# Patient Record
Sex: Male | Born: 1955 | Race: White | Hispanic: No
Health system: Southern US, Community
[De-identification: ages and names within clinical notes are randomized; demographics above are authoritative.]

---

## 2004-10-11 ENCOUNTER — Emergency Department: Payer: Self-pay | Admitting: Unknown Physician Specialty

## 2006-07-27 ENCOUNTER — Ambulatory Visit: Payer: Self-pay | Admitting: Physician Assistant

## 2010-05-28 ENCOUNTER — Inpatient Hospital Stay: Payer: Self-pay | Admitting: Internal Medicine

## 2010-06-22 ENCOUNTER — Inpatient Hospital Stay: Payer: Self-pay | Admitting: Internal Medicine

## 2014-01-01 ENCOUNTER — Emergency Department: Payer: Self-pay | Admitting: Emergency Medicine

## 2014-01-01 LAB — URINALYSIS, COMPLETE
Bacteria: NONE SEEN
Bilirubin,UR: NEGATIVE
Hyaline Cast: 2
Ketone: NEGATIVE
LEUKOCYTE ESTERASE: NEGATIVE
Nitrite: NEGATIVE
Ph: 6 (ref 4.5–8.0)
Protein: 30
RBC,UR: 4 /HPF (ref 0–5)
Specific Gravity: 1.017 (ref 1.003–1.030)
WBC UR: 1 /HPF (ref 0–5)

## 2014-01-01 LAB — DRUG SCREEN, URINE
AMPHETAMINES, UR SCREEN: NEGATIVE (ref ?–1000)
Barbiturates, Ur Screen: NEGATIVE (ref ?–200)
Benzodiazepine, Ur Scrn: POSITIVE (ref ?–200)
COCAINE METABOLITE, UR ~~LOC~~: NEGATIVE (ref ?–300)
Cannabinoid 50 Ng, Ur ~~LOC~~: POSITIVE (ref ?–50)
MDMA (ECSTASY) UR SCREEN: NEGATIVE (ref ?–500)
METHADONE, UR SCREEN: NEGATIVE (ref ?–300)
Opiate, Ur Screen: NEGATIVE (ref ?–300)
Phencyclidine (PCP) Ur S: NEGATIVE (ref ?–25)
Tricyclic, Ur Screen: POSITIVE (ref ?–1000)

## 2014-01-01 LAB — COMPREHENSIVE METABOLIC PANEL
ALK PHOS: 105 U/L
ALT: 36 U/L (ref 12–78)
ANION GAP: 5 — AB (ref 7–16)
Albumin: 2.2 g/dL — ABNORMAL LOW (ref 3.4–5.0)
BUN: 7 mg/dL (ref 7–18)
Bilirubin,Total: 1.9 mg/dL — ABNORMAL HIGH (ref 0.2–1.0)
CHLORIDE: 106 mmol/L (ref 98–107)
CO2: 26 mmol/L (ref 21–32)
CREATININE: 0.87 mg/dL (ref 0.60–1.30)
Calcium, Total: 8.1 mg/dL — ABNORMAL LOW (ref 8.5–10.1)
EGFR (African American): >60
EGFR (Non-African Amer.): >60
Glucose: 296 mg/dL — ABNORMAL HIGH (ref 65–99)
OSMOLALITY: 283 (ref 275–301)
Potassium: 3.9 mmol/L (ref 3.5–5.1)
SGOT(AST): 52 U/L — ABNORMAL HIGH (ref 15–37)
Sodium: 137 mmol/L (ref 136–145)
TOTAL PROTEIN: 6 g/dL — AB (ref 6.4–8.2)

## 2014-01-01 LAB — CBC
HCT: 35.8 % — ABNORMAL LOW (ref 40.0–52.0)
HGB: 12.4 g/dL — AB (ref 13.0–18.0)
MCH: 33.6 pg (ref 26.0–34.0)
MCHC: 34.5 g/dL (ref 32.0–36.0)
MCV: 97 fL (ref 80–100)
Platelet: 62 10*3/uL — ABNORMAL LOW (ref 150–440)
RBC: 3.68 10*6/uL — AB (ref 4.40–5.90)
RDW: 15.6 % — AB (ref 11.5–14.5)
WBC: 4.2 10*3/uL (ref 3.8–10.6)

## 2014-01-01 LAB — ETHANOL
Ethanol %: <0.003 % (ref 0.000–0.080)
Ethanol: <3 mg/dL

## 2014-01-01 LAB — PROTIME-INR
INR: 1.6
Prothrombin Time: 19.1 secs — ABNORMAL HIGH (ref 11.5–14.7)

## 2014-03-06 ENCOUNTER — Inpatient Hospital Stay: Payer: Self-pay | Admitting: Internal Medicine

## 2014-03-06 LAB — URINALYSIS, COMPLETE
Bacteria: NONE SEEN
Bilirubin,UR: NEGATIVE
Glucose,UR: NEGATIVE mg/dL (ref 0–75)
Hyaline Cast: 58
Ketone: NEGATIVE
Leukocyte Esterase: NEGATIVE
Nitrite: NEGATIVE
PH: 5 (ref 4.5–8.0)
Protein: 30
RBC,UR: 9 /HPF (ref 0–5)
Specific Gravity: 1.016 (ref 1.003–1.030)
Squamous Epithelial: 1

## 2014-03-06 LAB — CBC
HCT: 35.6 % — AB (ref 40.0–52.0)
HGB: 11.9 g/dL — AB (ref 13.0–18.0)
MCH: 31.9 pg (ref 26.0–34.0)
MCHC: 33.4 g/dL (ref 32.0–36.0)
MCV: 96 fL (ref 80–100)
PLATELETS: 81 10*3/uL — AB (ref 150–440)
RBC: 3.72 10*6/uL — ABNORMAL LOW (ref 4.40–5.90)
RDW: 15.8 % — ABNORMAL HIGH (ref 11.5–14.5)
WBC: 6.7 10*3/uL (ref 3.8–10.6)

## 2014-03-06 LAB — DRUG SCREEN, URINE
Amphetamines, Ur Screen: NEGATIVE (ref ?–1000)
BARBITURATES, UR SCREEN: NEGATIVE (ref ?–200)
Benzodiazepine, Ur Scrn: POSITIVE (ref ?–200)
COCAINE METABOLITE, UR ~~LOC~~: NEGATIVE (ref ?–300)
Cannabinoid 50 Ng, Ur ~~LOC~~: POSITIVE (ref ?–50)
MDMA (Ecstasy)Ur Screen: NEGATIVE (ref ?–500)
METHADONE, UR SCREEN: NEGATIVE (ref ?–300)
Opiate, Ur Screen: POSITIVE (ref ?–300)
PHENCYCLIDINE (PCP) UR S: NEGATIVE (ref ?–25)
TRICYCLIC, UR SCREEN: NEGATIVE (ref ?–1000)

## 2014-03-06 LAB — AMMONIA: Ammonia, Plasma: 57 mcmol/L — ABNORMAL HIGH (ref 11–32)

## 2014-03-06 LAB — COMPREHENSIVE METABOLIC PANEL
ALBUMIN: 2.4 g/dL — AB (ref 3.4–5.0)
ALT: 28 U/L
ANION GAP: 9 (ref 7–16)
Alkaline Phosphatase: 95 U/L
BUN: 22 mg/dL — AB (ref 7–18)
Bilirubin,Total: 2.4 mg/dL — ABNORMAL HIGH (ref 0.2–1.0)
CALCIUM: 8.8 mg/dL (ref 8.5–10.1)
CO2: 25 mmol/L (ref 21–32)
CREATININE: 1.47 mg/dL — AB (ref 0.60–1.30)
Chloride: 105 mmol/L (ref 98–107)
EGFR (African American): >60
EGFR (Non-African Amer.): 53 — ABNORMAL LOW
GLUCOSE: 224 mg/dL — AB (ref 65–99)
Osmolality: 288 (ref 275–301)
POTASSIUM: 3.9 mmol/L (ref 3.5–5.1)
SGOT(AST): 85 U/L — ABNORMAL HIGH (ref 15–37)
SODIUM: 139 mmol/L (ref 136–145)
TOTAL PROTEIN: 7.4 g/dL (ref 6.4–8.2)

## 2014-03-06 LAB — TROPONIN I: Troponin-I: 0.03 ng/mL

## 2014-03-06 LAB — ETHANOL

## 2014-03-07 LAB — CBC WITH DIFFERENTIAL/PLATELET
BASOS ABS: 0 10*3/uL (ref 0.0–0.1)
Basophil %: 0.9 %
Eosinophil #: 0.3 10*3/uL (ref 0.0–0.7)
Eosinophil %: 5.1 %
HCT: 32.7 % — AB (ref 40.0–52.0)
HGB: 11.1 g/dL — ABNORMAL LOW (ref 13.0–18.0)
LYMPHS ABS: 1.2 10*3/uL (ref 1.0–3.6)
LYMPHS PCT: 22.7 %
MCH: 32 pg (ref 26.0–34.0)
MCHC: 33.9 g/dL (ref 32.0–36.0)
MCV: 94 fL (ref 80–100)
Monocyte #: 0.5 x10 3/mm (ref 0.2–1.0)
Monocyte %: 9.9 %
NEUTROS ABS: 3.1 10*3/uL (ref 1.4–6.5)
Neutrophil %: 61.4 %
Platelet: 62 10*3/uL — ABNORMAL LOW (ref 150–440)
RBC: 3.46 10*6/uL — ABNORMAL LOW (ref 4.40–5.90)
RDW: 15.3 % — AB (ref 11.5–14.5)
WBC: 5.1 10*3/uL (ref 3.8–10.6)

## 2014-03-07 LAB — BASIC METABOLIC PANEL
Anion Gap: 10 (ref 7–16)
BUN: 18 mg/dL (ref 7–18)
CALCIUM: 8.5 mg/dL (ref 8.5–10.1)
CREATININE: 1.09 mg/dL (ref 0.60–1.30)
Chloride: 110 mmol/L — ABNORMAL HIGH (ref 98–107)
Co2: 25 mmol/L (ref 21–32)
EGFR (African American): >60
EGFR (Non-African Amer.): >60
Glucose: 95 mg/dL (ref 65–99)
Osmolality: 290 (ref 275–301)
POTASSIUM: 3.2 mmol/L — AB (ref 3.5–5.1)
Sodium: 145 mmol/L (ref 136–145)

## 2014-03-07 LAB — AMMONIA: AMMONIA, PLASMA: 49 umol/L — AB (ref 11–32)

## 2014-03-08 LAB — COMPREHENSIVE METABOLIC PANEL
ALBUMIN: 1.9 g/dL — AB (ref 3.4–5.0)
ALK PHOS: 82 U/L
ANION GAP: 6 — AB (ref 7–16)
BUN: 12 mg/dL (ref 7–18)
Bilirubin,Total: 1.3 mg/dL — ABNORMAL HIGH (ref 0.2–1.0)
CALCIUM: 8.4 mg/dL — AB (ref 8.5–10.1)
CO2: 28 mmol/L (ref 21–32)
Chloride: 107 mmol/L (ref 98–107)
Creatinine: 1.02 mg/dL (ref 0.60–1.30)
EGFR (Non-African Amer.): >60
Glucose: 89 mg/dL (ref 65–99)
Osmolality: 280 (ref 275–301)
Potassium: 3.3 mmol/L — ABNORMAL LOW (ref 3.5–5.1)
SGOT(AST): 73 U/L — ABNORMAL HIGH (ref 15–37)
SGPT (ALT): 29 U/L
SODIUM: 141 mmol/L (ref 136–145)
TOTAL PROTEIN: 6.3 g/dL — AB (ref 6.4–8.2)

## 2014-03-08 LAB — AMMONIA: Ammonia, Plasma: 51 mcmol/L — ABNORMAL HIGH (ref 11–32)

## 2014-03-09 LAB — COMPREHENSIVE METABOLIC PANEL
ALK PHOS: 80 U/L
Albumin: 1.9 g/dL — ABNORMAL LOW (ref 3.4–5.0)
Anion Gap: 9 (ref 7–16)
BILIRUBIN TOTAL: 1.2 mg/dL — AB (ref 0.2–1.0)
BUN: 13 mg/dL (ref 7–18)
Calcium, Total: 8.2 mg/dL — ABNORMAL LOW (ref 8.5–10.1)
Chloride: 105 mmol/L (ref 98–107)
Co2: 25 mmol/L (ref 21–32)
Creatinine: 1.08 mg/dL (ref 0.60–1.30)
EGFR (Non-African Amer.): >60
GLUCOSE: 143 mg/dL — AB (ref 65–99)
Osmolality: 280 (ref 275–301)
Potassium: 3.4 mmol/L — ABNORMAL LOW (ref 3.5–5.1)
SGOT(AST): 61 U/L — ABNORMAL HIGH (ref 15–37)
SGPT (ALT): 28 U/L
SODIUM: 139 mmol/L (ref 136–145)
TOTAL PROTEIN: 6.3 g/dL — AB (ref 6.4–8.2)

## 2014-03-09 LAB — AMMONIA: AMMONIA, PLASMA: 71 umol/L — AB (ref 11–32)

## 2014-03-10 LAB — CBC WITH DIFFERENTIAL/PLATELET
BASOS ABS: 0 10*3/uL (ref 0.0–0.1)
Basophil %: 0.8 %
EOS ABS: 0.5 10*3/uL (ref 0.0–0.7)
Eosinophil %: 8.5 %
HCT: 35.4 % — ABNORMAL LOW (ref 40.0–52.0)
HGB: 11.9 g/dL — AB (ref 13.0–18.0)
LYMPHS ABS: 1.6 10*3/uL (ref 1.0–3.6)
LYMPHS PCT: 26.5 %
MCH: 31.9 pg (ref 26.0–34.0)
MCHC: 33.6 g/dL (ref 32.0–36.0)
MCV: 95 fL (ref 80–100)
MONOS PCT: 10.4 %
Monocyte #: 0.6 x10 3/mm (ref 0.2–1.0)
Neutrophil #: 3.2 10*3/uL (ref 1.4–6.5)
Neutrophil %: 53.8 %
PLATELETS: 90 10*3/uL — AB (ref 150–440)
RBC: 3.73 10*6/uL — ABNORMAL LOW (ref 4.40–5.90)
RDW: 16 % — AB (ref 11.5–14.5)
WBC: 5.9 10*3/uL (ref 3.8–10.6)

## 2014-03-10 LAB — HEMOGLOBIN A1C: Hemoglobin A1C: 6.9 % — ABNORMAL HIGH (ref 4.2–6.3)

## 2014-03-10 LAB — AMMONIA: AMMONIA, PLASMA: 52 umol/L — AB (ref 11–32)

## 2014-03-15 ENCOUNTER — Emergency Department: Payer: Self-pay | Admitting: Emergency Medicine

## 2014-03-15 LAB — COMPREHENSIVE METABOLIC PANEL
ALT: 44 U/L
AST: 66 U/L — AB (ref 15–37)
Albumin: 2 g/dL — ABNORMAL LOW (ref 3.4–5.0)
Alkaline Phosphatase: 91 U/L
Anion Gap: 5 — ABNORMAL LOW (ref 7–16)
BUN: 9 mg/dL (ref 7–18)
Bilirubin,Total: 1.2 mg/dL — ABNORMAL HIGH (ref 0.2–1.0)
CO2: 26 mmol/L (ref 21–32)
Calcium, Total: 10.6 mg/dL — ABNORMAL HIGH (ref 8.5–10.1)
Chloride: 106 mmol/L (ref 98–107)
Creatinine: 1.08 mg/dL (ref 0.60–1.30)
EGFR (Non-African Amer.): >60
Glucose: 353 mg/dL — ABNORMAL HIGH (ref 65–99)
OSMOLALITY: 287 (ref 275–301)
POTASSIUM: 4.7 mmol/L (ref 3.5–5.1)
Sodium: 137 mmol/L (ref 136–145)
Total Protein: 6.9 g/dL (ref 6.4–8.2)

## 2014-03-15 LAB — CBC
HCT: 34 % — AB (ref 40.0–52.0)
HGB: 11.2 g/dL — ABNORMAL LOW (ref 13.0–18.0)
MCH: 31.6 pg (ref 26.0–34.0)
MCHC: 33 g/dL (ref 32.0–36.0)
MCV: 96 fL (ref 80–100)
Platelet: 68 10*3/uL — ABNORMAL LOW (ref 150–440)
RBC: 3.56 10*6/uL — AB (ref 4.40–5.90)
RDW: 15.9 % — ABNORMAL HIGH (ref 11.5–14.5)
WBC: 4.7 10*3/uL (ref 3.8–10.6)

## 2014-03-15 LAB — SALICYLATE LEVEL

## 2014-03-15 LAB — ACETAMINOPHEN LEVEL

## 2014-03-15 LAB — ETHANOL

## 2014-03-20 ENCOUNTER — Inpatient Hospital Stay: Payer: Self-pay | Admitting: Internal Medicine

## 2014-03-20 LAB — COMPREHENSIVE METABOLIC PANEL
ALT: 45 U/L
Albumin: 1.9 g/dL — ABNORMAL LOW (ref 3.4–5.0)
Alkaline Phosphatase: 78 U/L
Anion Gap: 8 (ref 7–16)
BUN: 30 mg/dL — ABNORMAL HIGH (ref 7–18)
Bilirubin,Total: 1 mg/dL (ref 0.2–1.0)
CHLORIDE: 105 mmol/L (ref 98–107)
Calcium, Total: 9.4 mg/dL (ref 8.5–10.1)
Co2: 24 mmol/L (ref 21–32)
Creatinine: 1.46 mg/dL — ABNORMAL HIGH (ref 0.60–1.30)
EGFR (African American): >60
EGFR (Non-African Amer.): 53 — ABNORMAL LOW
Glucose: 106 mg/dL — ABNORMAL HIGH (ref 65–99)
Osmolality: 280 (ref 275–301)
Potassium: 3.6 mmol/L (ref 3.5–5.1)
SGOT(AST): 61 U/L — ABNORMAL HIGH (ref 15–37)
Sodium: 137 mmol/L (ref 136–145)
TOTAL PROTEIN: 6.5 g/dL (ref 6.4–8.2)

## 2014-03-20 LAB — CBC
HCT: 29.9 % — ABNORMAL LOW (ref 40.0–52.0)
HGB: 10 g/dL — ABNORMAL LOW (ref 13.0–18.0)
MCH: 31.4 pg (ref 26.0–34.0)
MCHC: 33.3 g/dL (ref 32.0–36.0)
MCV: 94 fL (ref 80–100)
Platelet: 46 10*3/uL — ABNORMAL LOW (ref 150–440)
RBC: 3.17 10*6/uL — AB (ref 4.40–5.90)
RDW: 16 % — AB (ref 11.5–14.5)
WBC: 10.5 10*3/uL (ref 3.8–10.6)

## 2014-03-20 LAB — DRUG SCREEN, URINE
Amphetamines, Ur Screen: NEGATIVE (ref ?–1000)
BENZODIAZEPINE, UR SCRN: POSITIVE (ref ?–200)
Barbiturates, Ur Screen: NEGATIVE (ref ?–200)
COCAINE METABOLITE, UR ~~LOC~~: NEGATIVE (ref ?–300)
Cannabinoid 50 Ng, Ur ~~LOC~~: POSITIVE (ref ?–50)
MDMA (ECSTASY) UR SCREEN: NEGATIVE (ref ?–500)
Methadone, Ur Screen: NEGATIVE (ref ?–300)
Opiate, Ur Screen: NEGATIVE (ref ?–300)
Phencyclidine (PCP) Ur S: NEGATIVE (ref ?–25)
TRICYCLIC, UR SCREEN: POSITIVE (ref ?–1000)

## 2014-03-20 LAB — URINALYSIS, COMPLETE
Bacteria: NONE SEEN
Bilirubin,UR: NEGATIVE
Ketone: NEGATIVE
Leukocyte Esterase: NEGATIVE
NITRITE: NEGATIVE
PH: 5 (ref 4.5–8.0)
Protein: NEGATIVE
RBC,UR: 1 /HPF (ref 0–5)
SQUAMOUS EPITHELIAL: NONE SEEN
Specific Gravity: 1.017 (ref 1.003–1.030)

## 2014-03-20 LAB — ETHANOL: Ethanol: <3 mg/dL

## 2014-03-20 LAB — TROPONIN I: Troponin-I: <0.02 ng/mL

## 2014-03-20 LAB — AMMONIA: Ammonia, Plasma: 25 mcmol/L (ref 11–32)

## 2014-03-20 LAB — PROTIME-INR
INR: 1.5
Prothrombin Time: 17.4 secs — ABNORMAL HIGH (ref 11.5–14.7)

## 2014-03-21 LAB — CBC WITH DIFFERENTIAL/PLATELET
Basophil #: 0 10*3/uL (ref 0.0–0.1)
Basophil %: 0.3 %
EOS ABS: 0.1 10*3/uL (ref 0.0–0.7)
Eosinophil %: 2.4 %
HCT: 30 % — ABNORMAL LOW (ref 40.0–52.0)
HGB: 10.3 g/dL — AB (ref 13.0–18.0)
Lymphocyte #: 0.9 10*3/uL — ABNORMAL LOW (ref 1.0–3.6)
Lymphocyte %: 15 %
MCH: 32 pg (ref 26.0–34.0)
MCHC: 34.3 g/dL (ref 32.0–36.0)
MCV: 93 fL (ref 80–100)
MONO ABS: 0.6 x10 3/mm (ref 0.2–1.0)
Monocyte %: 9.5 %
NEUTROS PCT: 72.8 %
Neutrophil #: 4.2 10*3/uL (ref 1.4–6.5)
Platelet: 49 10*3/uL — ABNORMAL LOW (ref 150–440)
RBC: 3.21 10*6/uL — ABNORMAL LOW (ref 4.40–5.90)
RDW: 16.2 % — ABNORMAL HIGH (ref 11.5–14.5)
WBC: 5.8 10*3/uL (ref 3.8–10.6)

## 2014-03-21 LAB — BASIC METABOLIC PANEL
Anion Gap: 4 — ABNORMAL LOW (ref 7–16)
BUN: 22 mg/dL — AB (ref 7–18)
Calcium, Total: 8.7 mg/dL (ref 8.5–10.1)
Chloride: 111 mmol/L — ABNORMAL HIGH (ref 98–107)
Co2: 23 mmol/L (ref 21–32)
Creatinine: 1.17 mg/dL (ref 0.60–1.30)
EGFR (Non-African Amer.): >60
GLUCOSE: 73 mg/dL (ref 65–99)
Osmolality: 278 (ref 275–301)
Potassium: 3.5 mmol/L (ref 3.5–5.1)
SODIUM: 138 mmol/L (ref 136–145)

## 2014-04-02 DEATH — deceased

## 2014-10-24 NOTE — Discharge Summary (Signed)
PATIENT NAME:  Shaun Payne, Shaun Payne MR#:  [id number] DATE OF BIRTH:  1956-04-13  DATE OF ADMISSION:  03/20/2014  DATE OF DISCHARGE:  03/21/2014  PRIMARY CARE PHYSICIAN: Nonlocal.  PRIMARY CARE PHYSICIAN: Dr. Luanna SalkEeason.  DISCHARGE DIAGNOSES:  1. Drug-induced encephalopathy.  2. Acute renal failure. 3. Liver cirrhosis.  4. Hypertension.  5. Diabetes.  6. Thrombocytopenia.  7. Anemia.   CONDITION: Stable.   CODE STATUS: Full code.   HOME MEDICATIONS: Please refer to the medication reconciliation list.   ACTIVITY: As tolerated.   FOLLOW-UP CARE: Followup with PCP within 1 to 2 weeks. The patient need to resume home health and physical therapy. Also, the patient needs medications safety management due to drug-induced encephalopathy.   REASON FOR ADMISSION: Altered mental status.   HOSPITAL COURSE: The patient is a 59 year old male with a history of liver cirrhosis, hepatitis C, hypertension, diabetes who was sent from home to ED due to altered mental status. The patient's ammonia level was normal. Glucose was normal. The patient's urine drug screen showed positive tricyclic NT present, cannabinoid and benzodiazepines. The patient's CAT scan of head did not show any acute intracranial pathology. Ammonia level is normal.  1. Acute encephalopathy, possibly due to drug-induced encephalopathy. After admission, the patient's home medications were on hold. The patient has been treated with IV fluid support. The patient now is some alert, awake, oriented, but has poor insight, sometimes agitated and angry. According to the patient's sister, the patient is back to his baseline. I discussed the patient's condition and home environment with the patient's sister and otherwise, the patient's sister about medication safety. The patient medications may be logged.  2. Acute renal failure. The patient's BUN and creatinine was 30 and 1.46. After IV fluid support, the patient's renal failure improved.  3. Anemia  and thrombocytopenia, stable, possibly due to chronic liver disease.  4. The patient has no complaints. He is clinically stable and will be discharged to home with home health and PT today. I discussed the patient's discharge plan with the patient's sister, nurse and case manager.   TIME SPENT: About 77 minutes.     ____________________________ Shaun Payne Rosalinda Seaman, MD qc:TT D: 03/21/2014 13:40:09 ET T: 03/21/2014 17:39:31 ET JOB#: [id number]  cc: Shaun Payne Emit Kuenzel, MD, <Dictator> Shaun Payne Ameya Kutz MD ELECTRONICALLY SIGNED 03/23/2014 12:03
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
## 2014-10-24 NOTE — H&P (Signed)
PATIENT NAME:  Shaun Payne, Shaun Payne MR#:  [id number] DATE OF BIRTH:  [birth year]  DATE OF ADMISSION:  03/06/2014  ADMITTING PHYSICIAN: Enid Baasadhika Cherene Dobbins, MD.   PRIMARY CARE PHYSICIAN: None.   CHIEF COMPLAINT: Altered mental status.   BRIEF HISTORY: Shaun Payne is a 59 year old, Caucasian male with a past medical history significant for hepatitis C, liver cirrhosis and a past history of alcohol abuse, brought to the hospital by his niece as he was found   DICTATION ENDS HERE .     ____________________________ Enid Baasadhika Dj Senteno, MD rk:TT D: 03/06/2014 20:18:10 ET T: 03/06/2014 20:54:11 ET JOB#: [id number]  cc: Enid Baasadhika Genese Quebedeaux, MD, <Dictator> Enid BaasADHIKA Aadhira Heffernan MD ELECTRONICALLY SIGNED 03/09/2014 17:30

## 2016-06-12 IMAGING — CT CT HEAD WITHOUT CONTRAST
1 of 2 series · 13 of 30 positions shown, 17 images · non-contrast
Comparison: 03/06/2014

CLINICAL DATA: Patient presents to ed with family who reports pt
has been confused and disoriented. Pale at triage, with hypotension,
oriented to self and date of birth only, appears lethargic and weak.

EXAM:
CT HEAD WITHOUT CONTRAST
TECHNIQUE: Contiguous axial images were obtained from the base of the skull
through the vertex without intravenous contrast.

[Series 2: soft tissue · axial · 0.42mm/px · z∈[-61,+64]mm · 13 of 31 slices shown, 17 images]
[im 3/31  brain]
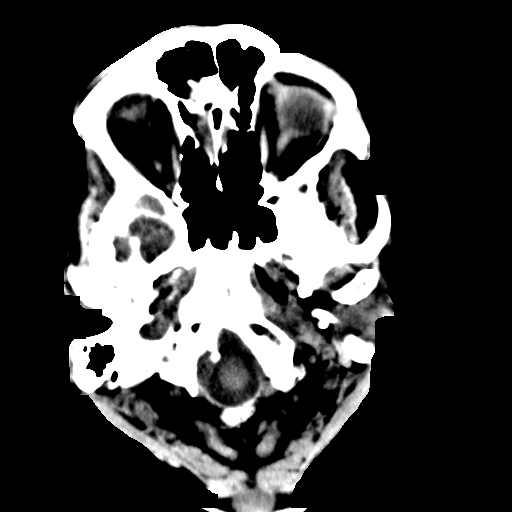
[im 3/31  bone]
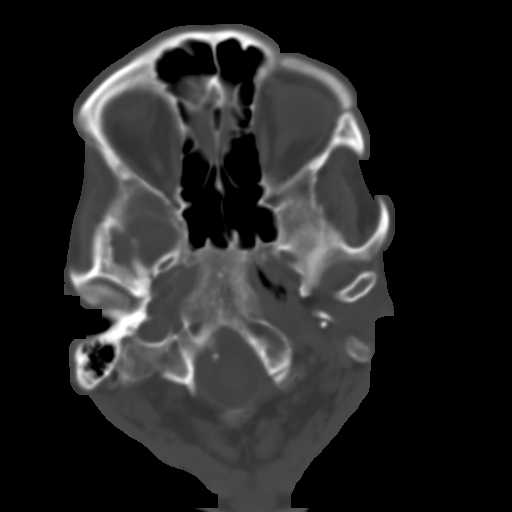
[im 5/31  brain]
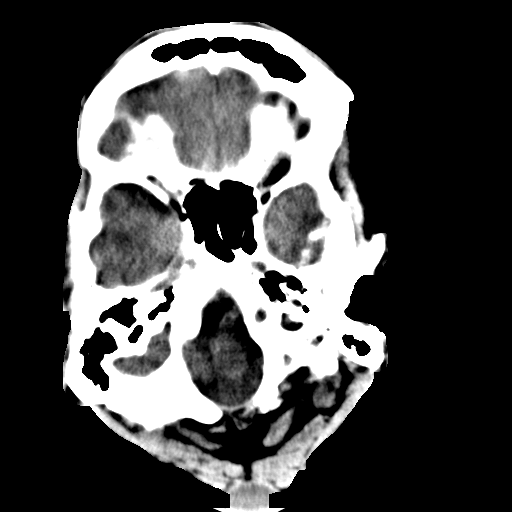
[im 7/31  brain]
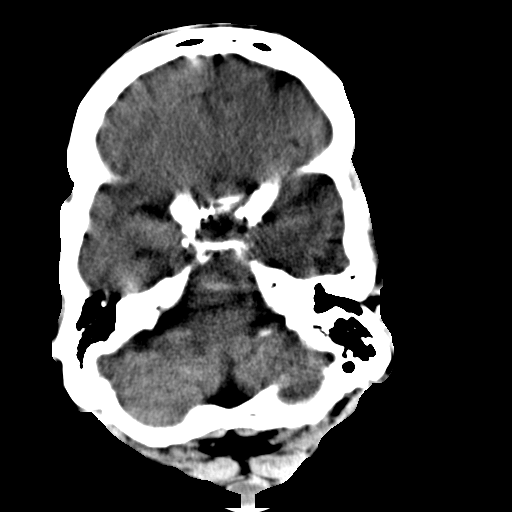
[im 9/31  brain]
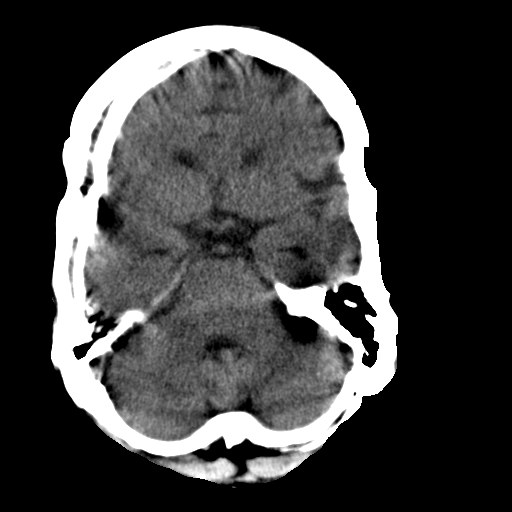
[im 11/31  brain]
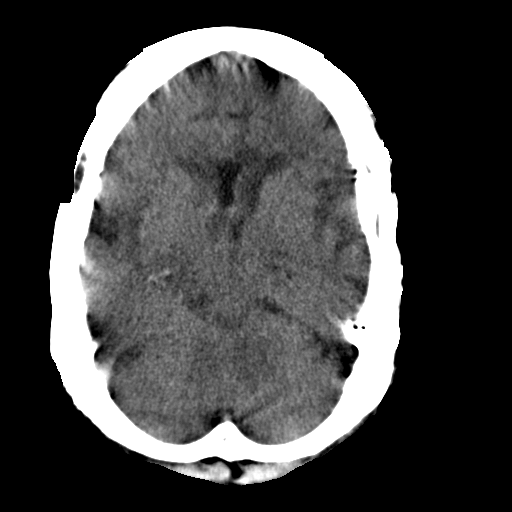
[im 11/31  bone]
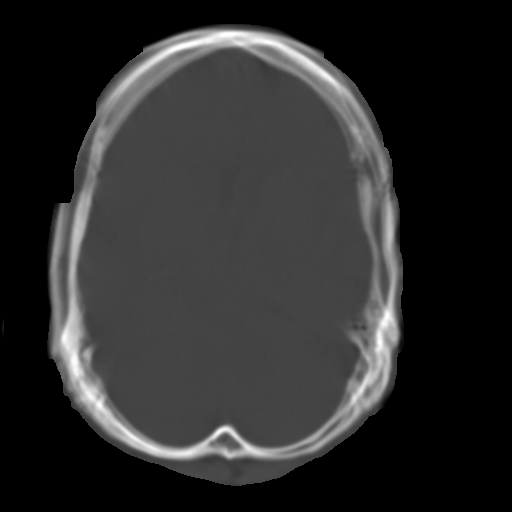
[im 13/31  brain]
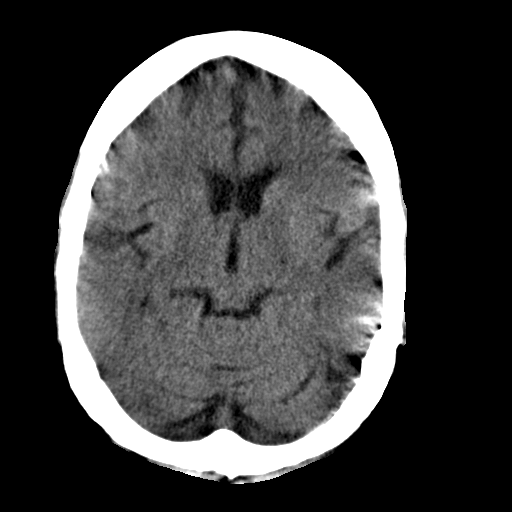
[im 16/31  brain]
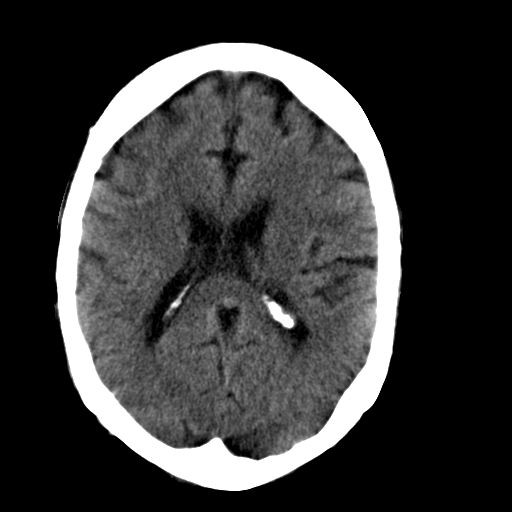
[im 18/31  brain]
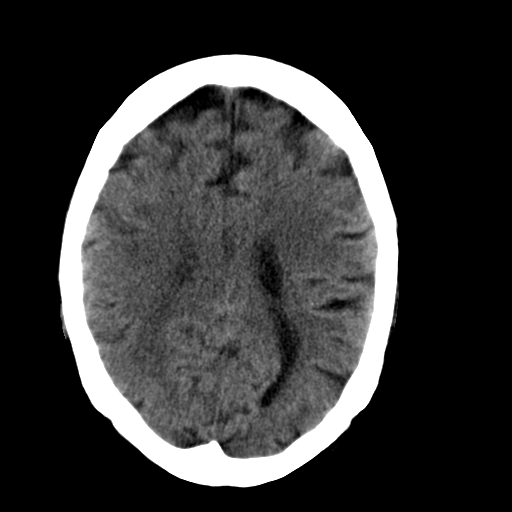
[im 20/31  brain]
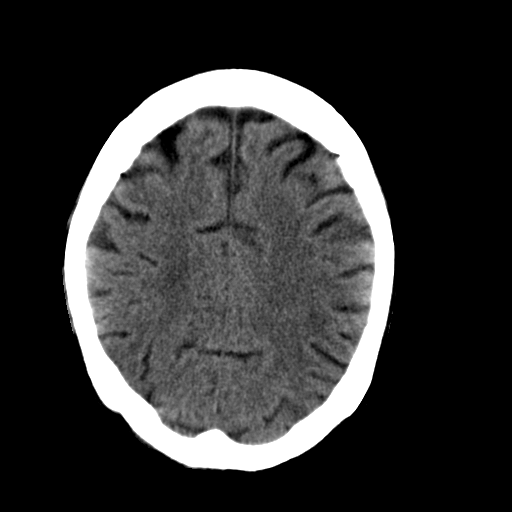
[im 20/31  bone]
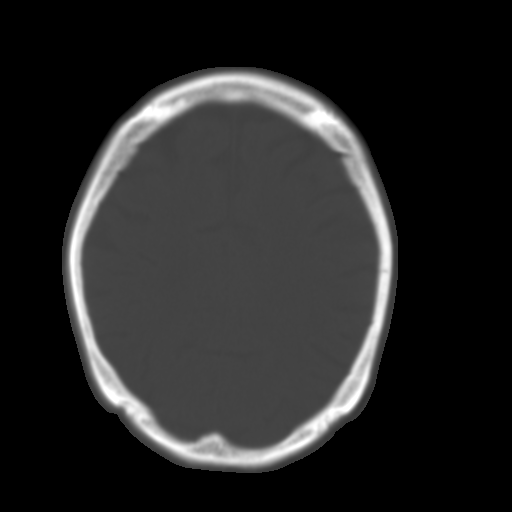
[im 22/31  brain]
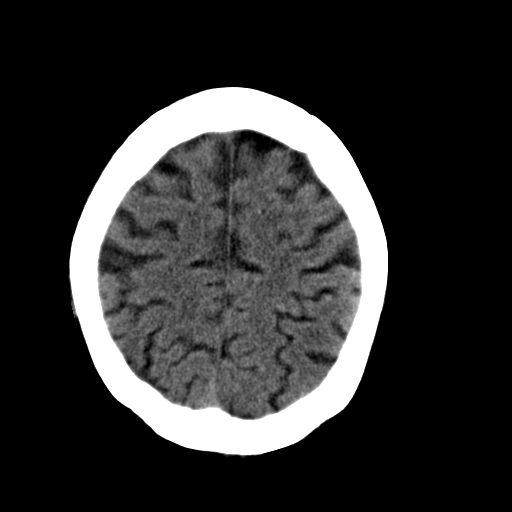
[im 24/31  brain]
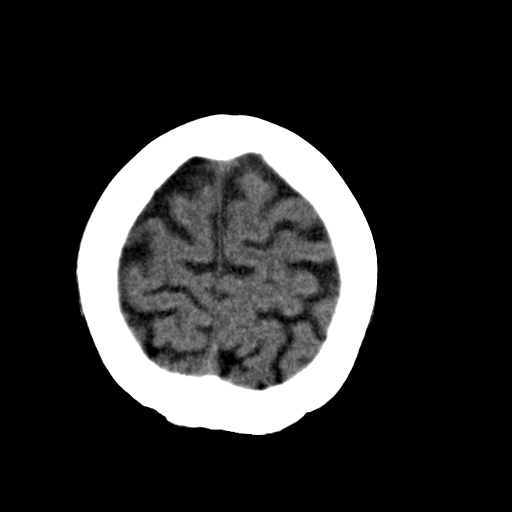
[im 26/31  brain]
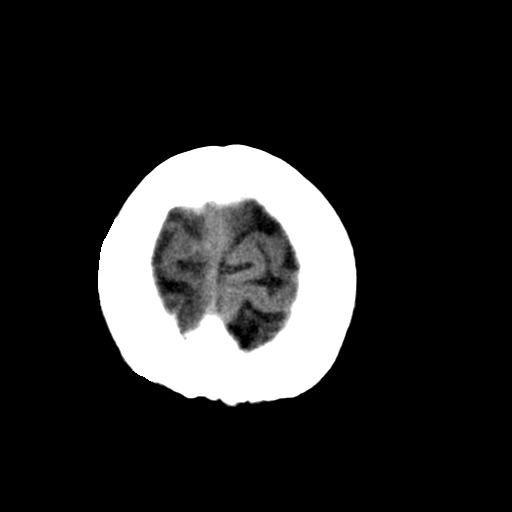
[im 28/31  brain]
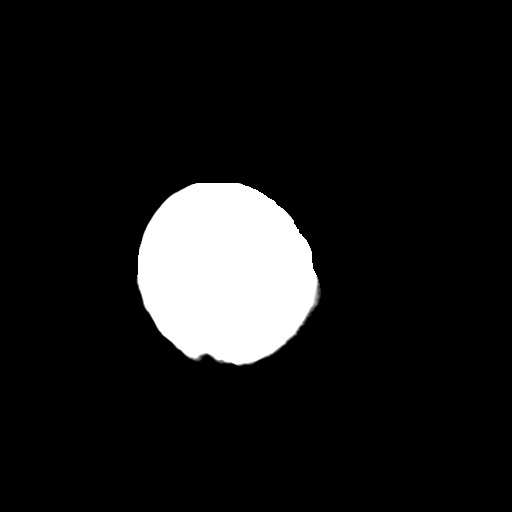
[im 28/31  bone]
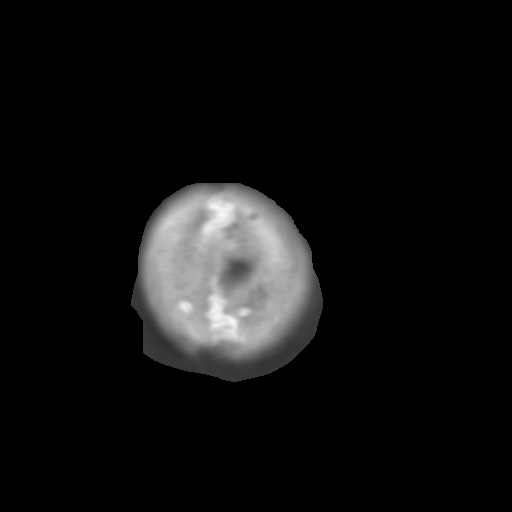

[13 of 30 positions shown; findings below may reference images not displayed]

FINDINGS: There is no evidence of mass effect, midline shift or extra-axial
fluid collections. There is no evidence of a space-occupying lesion
or intracranial hemorrhage. There is no evidence of a cortical-based
area of acute infarction.

The ventricles and sulci are appropriate for the patient's age. The
basal cisterns are patent.

Visualized portions of the orbits are unremarkable. The visualized
portions of the paranasal sinuses and mastoid air cells are
unremarkable.

The osseous structures are unremarkable.
IMPRESSION: No acute intracranial pathology.
